# Patient Record
Sex: Male | Born: 1985 | Race: White | Hispanic: No | Marital: Single | Smoking: Never smoker
Health system: Southern US, Community
[De-identification: ages and names within clinical notes are randomized; demographics above are authoritative.]

## PROBLEM LIST (undated history)

## (undated) HISTORY — PX: KNEE ARTHROSCOPY WITH ANTERIOR CRUCIATE LIGAMENT (ACL) REPAIR: SHX5644

## (undated) HISTORY — PX: SPLENECTOMY, TOTAL: SHX788

---

## 2017-08-21 ENCOUNTER — Encounter: Payer: Self-pay | Admitting: Emergency Medicine

## 2017-08-21 ENCOUNTER — Emergency Department (INDEPENDENT_AMBULATORY_CARE_PROVIDER_SITE_OTHER)
Admission: EM | Admit: 2017-08-21 | Discharge: 2017-08-21 | Disposition: A | Discharge status: Home / Self Care | Payer: Self-pay | Source: Home / Self Care | Attending: Emergency Medicine | Admitting: Emergency Medicine

## 2017-08-21 ENCOUNTER — Other Ambulatory Visit: Payer: Self-pay

## 2017-08-21 ENCOUNTER — Emergency Department (INDEPENDENT_AMBULATORY_CARE_PROVIDER_SITE_OTHER): Payer: Self-pay

## 2017-08-21 DIAGNOSIS — R4189 Other symptoms and signs involving cognitive functions and awareness: Secondary | ICD-10-CM

## 2017-08-21 DIAGNOSIS — S0990XA Unspecified injury of head, initial encounter: Secondary | ICD-10-CM
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
## 2017-08-21 NOTE — Discharge Instructions (Addendum)
Please make an appointment with a primary care physician for follow-up. I have given you a note to be out of work for 1 more week. You will need clearance from a primary care provider prior to returning to work.

## 2017-08-21 NOTE — ED Triage Notes (Signed)
Vertigo, nausea, headache, neck pain, right hand pain, tingling fingers x 2 weeks
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
